# Patient Record
Sex: Female | Born: 1960 | Race: White | Hispanic: No | Marital: Married
Health system: Midwestern US, Community
[De-identification: ages and names within clinical notes are randomized; demographics above are authoritative.]

## PROBLEM LIST (undated history)

## (undated) DIAGNOSIS — Z1231 Encounter for screening mammogram for malignant neoplasm of breast: Secondary | ICD-10-CM

## (undated) DIAGNOSIS — N6019 Diffuse cystic mastopathy of unspecified breast: Secondary | ICD-10-CM

## (undated) DIAGNOSIS — R928 Other abnormal and inconclusive findings on diagnostic imaging of breast: Secondary | ICD-10-CM

## (undated) DIAGNOSIS — Z803 Family history of malignant neoplasm of breast: Secondary | ICD-10-CM

## (undated) DIAGNOSIS — Z1239 Encounter for other screening for malignant neoplasm of breast: Secondary | ICD-10-CM

---

## 2012-04-27 ENCOUNTER — Inpatient Hospital Stay
Admit: 2012-04-27 | Discharge: 2012-04-27 | Disposition: A | Discharge status: Home / Self Care | Attending: Emergency Medicine

## 2012-04-27 LAB — CBC WITH AUTO DIFFERENTIAL
Basophils %: 0.3 % (ref 0.0–2.0)
Basophils Absolute: 0 10*3 (ref 0.0–0.2)
Eosinophils %: 1 % (ref 0.0–5.0)
Eosinophils Absolute: 0.1 10*3 (ref 0.0–0.6)
Granulocyte Absolute Count: 5.2 10*3 (ref 1.7–7.7)
Hematocrit: 37.7 % (ref 36.0–48.0)
Hemoglobin: 12.7 gm/dl (ref 12.0–16.0)
Lymphocytes %: 19 % — ABNORMAL LOW (ref 25.0–40.0)
Lymphocytes Absolute: 1.4 10*3 (ref 1.0–5.1)
MCH: 31.9 pg (ref 26–34)
MCHC: 33.6 gm/dl (ref 31–36)
MCV: 94.8 fl (ref 80–100)
MPV: 8.3 fl (ref 5.0–10.5)
Monocytes %: 8.1 % (ref 0.0–12.0)
Monocytes Absolute: 0.6 10*3 (ref 0.0–1.3)
Platelets: 226 10*3 (ref 135–450)
RBC: 3.98 10*6 — ABNORMAL LOW (ref 4.0–5.2)
RDW: 12.9 % (ref 11.5–14.5)
Segs Relative: 71.6 % — ABNORMAL HIGH (ref 42.0–63.0)
WBC: 7.2 10*3 (ref 4.0–11.0)

## 2012-04-27 LAB — PROTIME-INR
INR: 1.08
Protime: 12.3 s (ref 10.0–12.8)

## 2012-04-27 LAB — APTT: aPTT: 27.3 s

## 2012-04-27 MED ORDER — ENOXAPARIN SODIUM 80 MG/0.8ML SC SOLN
80 MG/0.8ML | Freq: Once | SUBCUTANEOUS | Status: AC
Start: 2012-04-27 — End: 2012-04-27
  Administered 2012-04-27: 16:00:00 via SUBCUTANEOUS

## 2012-04-27 MED ORDER — WARFARIN SODIUM 5 MG PO TABS
5 MG | Freq: Once | ORAL | Status: AC
Start: 2012-04-27 — End: 2012-04-27
  Administered 2012-04-27: 17:00:00 via ORAL

## 2012-04-27 MED FILL — COUMADIN 5 MG PO TABS: 5 MG | ORAL | Qty: 2

## 2012-04-27 MED FILL — LOVENOX 80 MG/0.8ML SC SOLN: 80 MG/0.8ML | SUBCUTANEOUS | Qty: 0.8

## 2012-04-27 NOTE — ED Provider Notes (Signed)
I have fully participated in the care of this patient and I have personally preformed a face to face evaluation.  I have reviewed and agree with all pertinent clinical information including history, physical exam and medical decision making/plan. I have also reviewed and agree with the medications, allergies and past medical, family and social history section for this patient, unless otherwise noted.     HPI: Frances [name] presented with   Chief Complaint   Patient presents with   . Leg Pain     pt sent from vascular lab with + test for DVT        expectation: find out how to treat it     The patient is a 51 year old female who had recent right knee surgery and was sent by her orthopedic surgeon for a vascular study to rule out DVT today because of swelling and pain in her right leg and calf extending up into the knee.   Has no prior history of DVT.  No chest pain or shortness of breath.  Today, the Doppler study was positive for DVT.    Vital Signs:  BP 116/79  Pulse 86  Temp(Src) 98.6 F (37 C) (Oral)  Resp 16  Ht 5' 4.5" (1.638 m)  Wt 178 lb (80.74 kg)  BMI 30.09 kg/m2  SpO2 98%  LMP 04/27/2012  Alert 51 y.o. female who does not appear toxic or acutely ill  there is soft tissue swelling and tenderness in the right calf with no palpable cord.  Neurovascular status distally is intact with good movement of the foot and ankle, good perfusion.      Medical Decision Making and Plan:  Pertinent Labs & Imaging studies reviewed. (See chart for details)  I agree with PA assessment and plan.   venous Doppler report per the limit or a vascular port shows acutely partially occluding DVT involving the right distal femoral vein, popliteal vein, posterior tibial and peroneal veins to zone 8 and gastrocnemius vein in zone 5.  There is an acute totally occluded superficial vein thrombosis involving the right lesser saphenous vein as well.  No evidence of DVT or superficial vein thrombosis in the right lower extremity  and left common femoral vein.  Reflux is noted in the right saphenous femoral junction.      Case was discussed with the patient's PCP, Dr.Drohan.  He will refer the patient to the Coumadin clinic and requests that we give her an initial dose of Lovenox and start her on Coumadin.  He is agreeable with outpatient treatment.         ED course: The patient is stable.  Subcutaneous Lovenox 1 mg per kilogram given.  Coumadin orally and treated as well.    Disposition: Home.  Outpatient followup with Coumadin clinic.  She will be self injecting with Lovenox until she is adequately anticoagulated with Coumadin.  Instructions for DVT.    Blaine Hamper, MD  04/27/12 1225

## 2012-04-27 NOTE — Discharge Instructions (Signed)
Call the Coumadin clinic today for the next time you are supposed to have your blood drawn.    ELEVATION  ELEVATE LOWER LEG BY PUTTING DRESSER DRAWER BETWEEN MATTRESS AND BOX SPRINGAT THE FOOT OF THE BED.  SLEEP THAT WAY FOR 2-3 WEEKS.  IT MAY TAKE A FEW DAYS TO GET USED TO SLEEPING THAT WAY.      Return here to the emergency department if worsening pain.        Deep Vein Thrombosis  A deep vein thrombosis (DVT) is a blood clot (thrombus) that develops in a deep vein. A DVT is a clot in the deep, larger veins of the leg, arm, or pelvis. These are more dangerous than clots that might form in veins on the surface of the body. Deep vein thrombosis can lead to complications if the clot breaks off and travels in the bloodstream to the lungs.  CAUSES  Blood clots form in a vein for different reasons. Usually several things cause blood clots. They include:   The flow of blood slows down.   The inside of the vein is damaged in some way.   The person has a condition that makes blood clot more easily. These conditions may include:   Older age (especially over 44 years old).   Having a history of blood clots.   Having major or lengthy surgery. Hip surgery is particularly high-risk.   Breaking a hip or leg.   Sitting or lying still for a long time.   Cancer or cancer treatment.   Having a long, thin tube (catheter) placed inside a vein during a medical procedure.   Being overweight (obese).   Pregnancy and childbirth.   Medicines with estrogen.   Smoking.   Other circulation or heart problems.  SYMPTOMS  When a clot forms, it can either partially or totally block the blood flow in that vein. Symptoms of a DVT can include:   Swelling of the leg or arm, especially if one side is much worse.   Warmth and redness of the leg or arm, especially if one side is much worse.   Pain in an arm or leg. If the clot is in the leg, symptoms may be more noticeable or worse when standing or walking.  If the blood clot travels  to the lung, it may cause:   Shortness of breath.   Chest pain. The pain may be worsened by deep breaths.   Coughing up thick mucus (phlegm), possibly flecked with blood.  Anyone with these symptoms should get emergency medical treatment right away. Call your local emergency services (911 in [place] if you have these symptoms.  DIAGNOSIS  If a DVT is suspected, your caregiver will take a full medical history. He or she will also perform a physical exam. Tests that also may be required include:   Studies of the clotting properties of the blood.   An ultrasound scan.   X-rays to show the flow of blood when special dye is injected into the veins (venography).   Studies of your lungs if you have any chest symptoms.  PREVENTION   Exercise the legs regularly. Take a brisk 30 minute walk every day.   Maintain a weight that is appropriate for your height.   Avoid sitting or lying in bed for long periods of time without moving your legs.   Women, particularly those over the age of 51, should consider the risks and benefits of taking estrogen medicines, including birth control pills.  Do not smoke, especially if you take estrogen medicines.   Long-distance travel can increase your risk. You should exercise your legs by walking or pumping the muscles every hour.   In hospital prevention:   Prevention may include medical and nonmedical measures.  TREATMENT   The most common treatment for DVT is blood thinning (anticoagulant) medicine, which reduces the blood's tendency to clot. Anticoagulants can stop new blood clots from forming and old ones from growing. They cannot dissolve existing clots. Your body does this by itself over time. Anticoagulants can be given by mouth, by intravenous (IV) access, or by injection. Your caregiver will determine the best program for you.   Less commonly, clot-dissolving drugs (thrombolytics) are used to dissolve a DVT. They carry a high risk of bleeding, so they are used mainly in  severe cases.   Very rarely, a blood clot in the leg needs to be removed surgically.   If you are unable to take anticoagulants, your caregiver may arrange for you to have a filter placed in a main vein in your belly (abdomen). This filter prevents clots from traveling to your lungs.  HOME CARE INSTRUCTIONS   Take all medicines prescribed by your caregiver. Follow the directions carefully.   You will most likely continue taking anticoagulants after you leave the hospital. Your caregiver will advise you on the length of treatment (usually 3 to 6 months, sometimes for life).   Taking too much or too little of an anticoagulant is dangerous. While taking this type of medicine, you will need to have regular blood tests to be sure the dose is correct. The dose can change for many reasons. It is critically important that you take this medicine exactly as prescribed, and that you have blood tests exactly as directed.   Many foods can interfere with anticoagulants. These include foods high in vitamin K, such as spinach, kale, broccoli, cabbage, collard and turnip greens, Brussels sprouts, peas, cauliflower, seaweed, parsley, beef and pork liver, green tea, and soybean oil. Your caregiver should discuss limits on these foods with you or you should arrange a visit with a dietician to answer your questions.   Many medicines can interfere with anticoagulants. You must tell your caregiver about any and all medicines you take. This includes all vitamins and supplements. Be especially cautious with aspirin and anti-inflammatory medicines. Ask your caregiver before taking these.   Anticoagulants can have side effects, mostly excessive bruising or bleeding. You will need to hold pressure over cuts for longer than usual. Avoid alcoholic drinks or consume only very small amounts while taking this medicine.   If you are taking an anticoagulant:   Wear a medical alert bracelet.   Notify your dentist or other caregivers before  procedures.   Avoid contact sports.   Ask your caregiver how soon you can go back to normal activities. Not being active can lead to new clots. Ask for a list of what you should and should not do.   Exercise your lower leg muscles. This is important while traveling.   You may need to wear compression stockings. These are tight elastic stockings that apply pressure to the lower legs. This can help keep the blood in the legs from clotting.   If you are a smoker, you should quit.   Learn as much as you can about DVT.  SEEK MEDICAL CARE IF:   You have unusual bruising or any bleeding problems.   The swelling or pain in your affected arm  or leg is not gradually improving.   You anticipate surgery or long-distance travel. You should get specific advice on DVT prevention.   You discover other family members with blood clots. This may require further testing for inherited diseases or conditions.  SEEK IMMEDIATE MEDICAL CARE IF:   You develop chest pain.   You develop severe shortness of breath.   You begin to cough up bloody mucus or phlegm (sputum).   You feel dizzy or faint.   You develop swelling or pain in the leg.   You have breathing problems after traveling.  MAKE SURE YOU:   Understand these instructions.   Will watch your condition.   Will get help right away if you are not doing well or get worse.  Document Released: 12/13/2005 Document Revised: 12/02/2011 Document Reviewed: 02/04/2011  [facility] Patient Information 2012 [place].

## 2012-04-27 NOTE — ED Provider Notes (Signed)
HPI  Chief Complaint:    Chief Complaint   Patient presents with   ??? Leg Pain     pt sent from vascular lab with + test for DVT        expectation: find out how to treat it       Nursing Notes, Past Medical Hx, Past Surgical Hx, Social Hx, Allergies, and Family Hx were all reviewed and agreed with or any disagreements were addressed in the HPI.    HPI:  (Location, Duration, Timing, Severity, Quality, Assoc Sx, Context, Modifying factors)  This is [name]  51 y.o. female who presents to the emergency department stating that she had surgery on April 18, 2012.  She states that she went for her one-week postop appointment yesterday, was seen and told him he was having pain in her right leg.  She was also having swelling to the area.  Denies any fevers or chills, no nausea vomiting.  No chest pain or shortness of breath.  The orthopedic doctor, Dr. Clelia Croft, referred her to get an ultrasound.  That comes back today and is positive for DVT.  She presents here from the vascular lab.    Past Medical/Surgical History:  History reviewed. No pertinent past medical history.      Procedure Laterality Date   ??? Knee arthroscopy     ??? Breast biopsy         Medications:  No current facility-administered medications on file prior to encounter.     Current Outpatient Prescriptions on File Prior to Encounter   Medication Sig Dispense Refill   ??? warfarin (COUMADIN) 5 MG tablet Take 1 tablet by mouth every evening for 7 doses.  7 tablet  0       Review of Systems   Constitutional: Negative for fever and chills.   HENT: Negative for neck pain.    Respiratory: Negative for cough and shortness of breath.    Cardiovascular: Positive for leg swelling. Negative for chest pain and palpitations.   Gastrointestinal: Negative for nausea, vomiting and abdominal pain.   Genitourinary: Negative for dysuria.   All other systems reviewed and are negative.        Physical Exam   Nursing note and vitals reviewed.  Constitutional: She is oriented to person,  place, and time. She appears well-developed and well-nourished.   HENT:   Head: Normocephalic and atraumatic.   Right Ear: External ear normal.   Left Ear: External ear normal.   Nose: Nose normal.   Eyes: Right eye exhibits no discharge. Left eye exhibits no discharge.   Neck: Normal range of motion. Neck supple.   Cardiovascular: Normal rate, regular rhythm, normal heart sounds and intact distal pulses.  Exam reveals no gallop and no friction rub.    No murmur heard.  Pulmonary/Chest: Effort normal and breath sounds normal. No stridor. No respiratory distress. She has no wheezes. She has no rales. She exhibits no tenderness.   Genitourinary:   Stool Hemoccult controls are done on each sample card.  If stool hemoccult was done today, then controls were done as well.   Musculoskeletal: Normal range of motion. She exhibits edema and tenderness.        Right lower leg: She exhibits tenderness, swelling and edema.        Legs:  Neurological: She is alert and oriented to person, place, and time.   Skin: Skin is warm and dry. She is not diaphoretic. No pallor.   Psychiatric: She has [name]  mood and affect. Her behavior is normal.       Procedures    MDM    MEDICAL DECISION MAKING    Vitals:    Filed Vitals:    04/27/12 1136   BP: 116/79   Pulse: 86   Temp: 98.6 ??F (37 ??C)   TempSrc: Oral   Resp: 16   Height: 5' 4.5" (1.638 m)   Weight: 178 lb (80.74 kg)   SpO2: 98%       LABS:   Labs Reviewed   CBC WITH AUTO DIFFERENTIAL - Abnormal; Notable for the following:     RBC 3.98 (*)     Segs Relative 71.6 (*)     Lymphocytes Relative 19.0 (*)     All other components within normal limits   APTT   PROTIME-INR   THROMBOTIC VENOUS PANEL        Remainder of labs reviewed and were negative at this time or not returned at the time of this note.    Orders:  Orders Placed This Encounter   Procedures   ??? CBC auto differential   ??? APTT   ??? Protime-INR   ??? Thrombotic venous panel   ??? Inpatient consult to Internal Medicine   ??? Inpatient  consult to Orthopedic Surgery       EKG:     X-RAYS: The attending, Blaine Hamper, MD, and I have viewed the radiologic plain film image(s).  The plain films will be read or overread by the radiologist.  ALL OTHER NON-PLAIN FILM IMAGES SUCH AS CT, ULTRASOUND AND MRI HAVE BEEN READ BY THE RADIOLOGIST.        Vl Extremity Venous Right    04/27/2012  Vascular Lower Extremities DVT Study Procedure  -- PRELIMINARY SONOGRAPHER REPORT --    Demographics    Patient Name      Frances [name]    Date of Study     04/27/2012         Gender             Female    Patient Number    [id number]         Date of Birth      03-06-1961    Visit Number      [id number]        Age                62 year(s)    Accession Number  [id number]           Room Number    Corporate ID      [id number]           Sonographer        Christella Noa RVT,                                                          RDCS, RT    Ordering          Tammi Sou MD Interpreting       Elwin Mocha,  Physician                            Physician          Holenarsipur, MD   Procedure  Type of  Study:    Veins:Lower Extremities DVT Study, VL EXTREMITY VENOUS DUPLEX RIGHT.   Tech Comments Right Acute partially occluding deep vein thrombosis involving the right distal femoral vein,popliteal vein, posterior tibial veins, peroneal veins to zone 8 and gastrocnemius vein zone 5. Acute totally occluded superficial venous thrombosis involving the right lesser saphenous vein zone 5. No other evidence of deep vein or superficial vein thrombosis involving the right lower extremity and the left proximal common femoral vein Reflux noted in the right saphenous femoral junction..  Patient was transferred to the Emergency room per Dr. Clelia Croft.        PROCEDURES: [name]    CRITICAL CARE:  [name]    CONSULTATIONS: Consultation with Dr. Clelia Croft and her family Dr. Dr. Cathi Roan.    MEDICAL DECISION MAKING / ED COURSE:    Patient was given the following medications:  Medications   enoxaparin (LOVENOX)  injection 80 mg (80 mg Subcutaneous Given 04/27/12 1217)   warfarin (COUMADIN) tablet 10 mg (10 mg Oral Given 04/27/12 1256)       This patient has partially occluded DVT not completely occluding DVT.  We will give her Lovenox here and start her on Coumadin and then discharge her home with Lovenox and Coumadin.  Nothing to suggest pulmonary embolus at this time, she has normal oxygen saturation rate, she is not tachypneic or tachycardic.    The patient tolerated their visit well.  They were seen and evaluated by the attending physician who agreed with the assessment and plan.  The patient and / or the family were informed of the results of any tests, [name] time was given to answer questions, [name] plan was proposed and they agreed with plan.      CLINICAL IMPRESSION:    1. DVT (deep venous thrombosis)        DISPOSITION Decision to Discharge    PATIENT REFERRED TO:  coumadin clinic  [phone]  Today  for [name] recheck next week      DISCHARGE MEDICATIONS:  Discharge Medication List as of 04/27/2012 12:57 PM      START taking these medications    Details   warfarin (COUMADIN) 5 MG tablet Take 1 tablet by mouth every evening for 7 doses., Disp-7 tablet, R-0      enoxaparin (LOVENOX) 80 MG/0.8ML injection Inject 0.8 mLs into the skin 2 times daily for 7 days. May sub 14 vials, Disp-11.2 mL, R-0             (Please note the MDM and HPI sections of this note were completed with [name] voice recognition program.  Efforts were made to edit the dictations but occasionally words are mis-transcribed.)        [place]lin [place], [place]  04/27/12 1613

## 2012-05-03 LAB — THROMBOTIC VENOUS PANEL
Activated Protein C Resistance: 3.5 ratio (ref >=2.00)
Anti-Throm Enz: 88 % (ref 76–128)
Anticardiolipin IgA: 3 APL Units (ref 0–11)
Anticardiolipin IgG: 2 GPL Units (ref 0–14)
Cardiolipin Ab IgM: 0 MPL Units (ref 0–12)
Dilute Viper Venom Time: 37 s (ref 33–44)
Factor V Leiden: NEGATIVE
Homocystine,Blood: 7 umol/L (ref <=10)
Protein C-Functional: 89 % (ref 77–173)
Protein S Ag, Free: 86 % (ref 55–123)
Prothrombin Mutation: NEGATIVE
Protime: 13.5 s (ref 12.0–15.5)
aPTT: 41 s (ref 32–48)

## 2012-05-03 LAB — PROTIME-INR: INR: 2.8

## 2012-05-03 NOTE — Telephone Encounter (Signed)
Warfarin prescription phoned  into [facility] to [facility] under Dr. Marcelina Morel  Warfarin 5 mg tabs  Take 5mg  daily  90 days x no refills    Domenica Fail, Pharm D

## 2012-05-03 NOTE — Progress Notes (Signed)
Mrs. Frances [name] is a 51 y.o. y/o female with history of DVT who presents today for anticoagulation monitoring and adjustment.  Patient verifies current dosing regimen as listed above.  Patient denies s/s bleeding/bruising/swelling/SOB.  No blood in urine or stool.  No dietary changes.  No changes in medication/OTC agents/Herbals.  No Procedures scheduled in the future at this time.  Educated patient of signs and symptoms of bleeding and clotting, when to seek emergent care, the need to maintain a consistent diet and notification of clinic for any new medications including over the counter medications or abnormal bleeding.    INR 2.8 today  Pt stated she was given 10 mg of warfarin upon discharge (5-2), then continued with 5mg  daily. Has had 35 mg of warfarin in last 6 days. Takes her warfarin at dinner.  Has also been using the Lovenox injections.  Will have pt continue with 5mg  daily (35mg /week) and RTC in one week    Referring physician: Dr Trenton Founds  INR (no units)   Date Value   05/03/2012 2.8    04/27/2012 1.08

## 2012-05-10 LAB — PROTIME-INR: INR: 4.9

## 2012-05-10 NOTE — Progress Notes (Signed)
Mrs. XIARA KNISLEY is a 51 y.o. y/o female with history of DVT who presents today for anticoagulation monitoring and adjustment.  Patient verifies current dosing regimen as listed above.  Patient denies s/s bleeding/bruising/swelling/SOB.  No blood in urine or stool.  No dietary changes.  No changes in medication/OTC agents/Herbals.  No Procedures scheduled in the future at this time.  Educated patient of signs and symptoms of bleeding and clotting, when to seek emergent care, the need to maintain a consistent diet and notification of clinic for any new medications including over the counter medications or abnormal bleeding.    INR 4.9 today  Pt has swollen right calf today - same side as surgery. She returned to work this week and hasn't been able to elevate her leg as much as before.  Pt also reports that she stopped eating vegetables completely this week; explains high INR.  Hold today's dose. Will reduce Friday's dose to 2.5mg  with 5mg  all other days.    Referring physician: Dr Trenton Founds  INR (no units)   Date Value   05/10/2012 4.9    05/03/2012 2.8    04/27/2012 1.08

## 2012-05-17 LAB — PROTIME-INR: INR: 3

## 2012-05-17 NOTE — Progress Notes (Signed)
Mrs. Frances [name] is a 51 y.o. y/o female with history of DVT who presents today for anticoagulation monitoring and adjustment.  Patient verifies current dosing regimen as listed above.  Patient denies s/s bleeding/bruising/swelling/SOB.  No blood in urine or stool.  No dietary changes.  No changes in medication/OTC agents/Herbals.  No Procedures scheduled in the future at this time.    INR 3 today  Took warfarin as directed last visit; added in some salads/vegetables since last visit  Continues to have some swelling in R foot but relieved with elevation  Will be leaving for [facility] on Saturday; will return 5-31  Since pt is at higher end of range with the change in diet, will decrease dose slightly by another 2.5mg /week  RTC 05-29-12      Referring physician: Dr Trenton Founds  INR (no units)   Date Value   05/17/2012 3    05/10/2012 4.9    05/03/2012 2.8    04/27/2012 1.08

## 2012-05-17 NOTE — Telephone Encounter (Signed)
Called & spoke to [facility] @ Dr. Bascom Levels office.  She states they did not receive the fax.  Confirmed fax#.  She asked me to re-fax to her attention.  Re-faxed & called the office.  Spoke to [place].  She confirmed that [facility] received our fax.  This was the latest AVS which included all of the INR results for May.

## 2012-05-29 LAB — PROTIME-INR: INR: 2.4

## 2012-05-29 NOTE — Progress Notes (Signed)
Mrs. Frances [name] is a 51 y.o. y/o female with history of DVT who presents today for anticoagulation monitoring and adjustment.  Patient verifies current dosing regimen as listed above.  Patient denies s/s bleeding/bruising/swelling/SOB.  No blood in urine or stool.  No dietary changes.  No changes in medication/OTC agents/Herbals.  No Procedures scheduled in the future at this time.    INR 2.4 today  Continues to have some swelling in R foot but relieved with elevation. Is much better since last visit.  Start Physical therapy today    Missed dose Monday 5/27. Will continue same dose and consider reduction of elevated on next visit    RTC 2 weeks    Referring physician: Dr Trenton Founds  INR (no units)   Date Value   05/17/2012 3    05/10/2012 4.9    05/03/2012 2.8    04/27/2012 1.08

## 2012-06-12 LAB — PROTIME-INR: INR: 2.6

## 2012-06-12 NOTE — Progress Notes (Signed)
Mrs. Frances [name] is a 51 y.o. y/o female with history of DVT who presents today for anticoagulation monitoring and adjustment.  Patient verifies current dosing regimen as listed above.  Patient denies s/s bleeding/bruising/swelling/SOB.  No blood in urine or stool.  No dietary changes.  No changes in medication/OTC agents/Herbals.  No Procedures scheduled in the future at this time.    INR 2.6 today  Continues to have some swelling in R foot but relieved with elevation. Is much better since last visit.  In Physical therapy   RTC 4 weeks    Referring physician: Dr Trenton Founds  INR (no units)   Date Value   06/12/2012 2.6    05/29/2012 2.4    05/17/2012 3    05/10/2012 4.9

## 2012-06-13 ENCOUNTER — Encounter

## 2012-06-14 NOTE — Progress Notes (Signed)
[facility]  Progress Note  Frances Bushman, MD  [phone]  [phone]       Hollice Gong   Date of Birth:  [birth year]    Date of Visit:  06/14/2012    History of Present Illness  Patient presents for annual breast exam.      Relevant Past Medical/Surgical History  Past Surgical History   Procedure Laterality Date   ??? Knee arthroscopy     ??? Breast biopsy     No past medical history on file.       Review of Systems    Breast Mass: No  Breast Pain: No  Nipple Discharge: No  Skin Lesions/Changes: No  Weight Changes: No  Other: No    Physical Exam   Constitutional: She is oriented to person, place, and time. She appears well-developed and well-nourished. No distress.   Neck: Normal range of motion. Neck supple. No thyromegaly present.   Pulmonary/Chest:       Musculoskeletal: Normal range of motion.   Lymphadenopathy:     She has no cervical adenopathy.   Neurological: She is alert and oriented to person, place, and time.   Skin: Skin is warm and dry. No rash noted. No erythema.   Psychiatric: She has a normal mood and affect. Her behavior is normal. Thought content normal.       Assessment  Patient presents with benign breast examination and stable bilateral screening mammogram.    Plan  Patient is to return in one year for a repeat breast examination and bilateral screening mammogram.

## 2012-07-10 LAB — PROTIME-INR
INR: 1.5
Protime: 18.2 seconds

## 2012-07-10 NOTE — Telephone Encounter (Signed)
Warfarin prescription phoned  into [facility] for [facility] [id number] Dr. Trenton Founds  Warfarin 5 mg tabs  Take 2.5mg  on Mon and 5mg  all other days  90 days x 1 refill

## 2012-07-10 NOTE — Progress Notes (Signed)
Mrs. BRAYLEN DENUNZIO is a 51 y.o. 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  Patient verifies current dosing regimen as listed above.  Patient denies s/s bleeding/bruising/swelling/SOB.  No blood in urine or stool.  Note dietary changes. Pt has increased her salads and vegetable diet and plans to continue with current level  No changes in medication/OTC agents/Herbals.  No Procedures scheduled in the future at this time.  No missed doses  INR 1.5 today  Continues to have some swelling in R foot but relieved with elevation. Is much better since last visit.  In Physical therapy   RTC 10 days    Referring physician: Dr Trenton Founds  INR (no units)   Date Value   07/10/2012 1.5    06/12/2012 2.6    05/29/2012 2.4    05/17/2012 3

## 2012-07-19 LAB — PROTIME-INR
INR: 1.8
Protime: 22 seconds

## 2012-07-19 NOTE — Progress Notes (Signed)
Mrs. Frances [name] is a 51 y.o. 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  Patient verifies current dosing regimen as listed above.  Patient denies s/s bleeding/bruising/swelling/SOB.  No blood in urine or stool.  Note dietary changes. Pt has cut back a little on her salads and vegetable diet   No changes in medication/OTC agents/Herbals.  No Procedures scheduled in the future at this time.  No missed doses    INR 1.8 today  Swelling is greatly reduced  Take 7.5mg  today only then increase weekly dose to 5mg  daily  RTC 2 weeks    Referring physician: Dr Trenton Founds  INR (no units)   Date Value   07/19/2012 1.8    07/10/2012 1.5    06/12/2012 2.6    05/29/2012 2.4

## 2012-08-02 LAB — PROTIME-INR: INR: 2.6

## 2012-08-02 NOTE — Progress Notes (Signed)
Mrs. Frances [name] is a 51 y.o. 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  Patient verifies current dosing regimen as listed above.  Patient denies s/s bleeding/bruising/swelling/SOB.  No blood in urine or stool.  No dietary changes.   No changes in medication/OTC agents/Herbals.  No Procedures scheduled in the future at this time.  No missed doses    INR 2.6 today  Slightly more swelling in leg today; increased warfarin as instructed last visit  Will contact doctor re: f/u ultrasound to see status of clots/if she needs to continue warfarin  Continue same dose; RTC 2 weeks  If pt remains therapeutic consider 4 week appts     Referring physician: Dr Trenton Founds  Phone: [phone]  Fax: [phone]  INR (no units)   Date Value   08/02/2012 2.6    07/19/2012 1.8    07/10/2012 1.5    06/12/2012 2.6

## 2012-08-16 LAB — PROTIME-INR
INR: 2.2
Protime: 26.3 seconds

## 2012-08-16 NOTE — Progress Notes (Signed)
Mrs. Frances [name] is a 51 y.o. 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  Patient verifies current dosing regimen as listed above.  Patient denies s/s bleeding/bruising/swelling/SOB.  No blood in urine or stool.  No dietary changes.   No changes in medication/OTC agents/Herbals.  No Procedures scheduled in the future at this time.  No missed doses  Some swelling continues in leg;   Discussed length of therapy. Informed Pt that typically it would be for 6 months since this is her first DVT.  She must be re evaluated by her referring physician at that time.    INR 2.2 today  Continue same dose;   RTC 4 weeks    Referring physician: Dr Trenton Founds send by fax (Epic has not been saving the number)  Phone: [phone]  Fax: [phone]  INR (no units)   Date Value   08/16/2012 2.2    08/02/2012 2.6    07/19/2012 1.8    07/10/2012 1.5

## 2012-09-13 LAB — PROTIME-INR
INR: 2.2
Protime: 26.1 seconds

## 2012-09-13 NOTE — Progress Notes (Signed)
Mrs. Frances [name] is a 51 y.o. 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  Patient verifies current dosing regimen as listed above.  Patient denies s/s bleeding/bruising/swelling/SOB.  No blood in urine or stool.  No dietary changes.   No changes in medication/OTC agents/Herbals.  No Procedures scheduled in the future at this time.  No missed doses  Some swelling continues in leg;   Discussed length of therapy. Informed Pt that typically it would be for 6 months since this is her first DVT.  She must be re evaluated by her referring physician at that time.    INR 2.2 today  Continue same dose;   RTC 4 weeks    Referring physician: Dr Trenton Founds send by fax (Epic has not been saving the number)  Phone: [phone]  Fax: [phone]  INR (no units)   Date Value   09/13/2012 2.2    08/16/2012 2.2    08/02/2012 2.6    07/19/2012 1.8

## 2012-10-11 LAB — PROTIME-INR: INR: 1.6

## 2012-10-11 NOTE — Progress Notes (Signed)
Mrs. Frances [name] is a 51 y.o. 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  Patient verifies current dosing regimen as listed above.  Patient denies s/s bleeding/bruising/swelling/SOB.  No blood in urine or stool.  No dietary changes.   No changes in medication/OTC agents/Herbals.  No Procedures scheduled in the future at this time    INR 1.6 today  Forgot to take her warfarin on Friday. No other changes  Will have pt take 7.5mg  tonight then continue taking 5mg  daily  RTC 4 weeks    Referring physician: Dr Trenton Founds send by fax (Epic has not been saving the number)  Phone: [phone]  Fax: [phone]  INR (no units)   Date Value   10/11/2012 1.6    09/13/2012 2.2    08/16/2012 2.2    08/02/2012 2.6

## 2012-10-19 NOTE — Telephone Encounter (Signed)
Message copied by Elisha Headland on Thu Oct 19, 2012  2:23 PM  ------       Message from: Clance Boll A       Created: Thu Oct 19, 2012  1:44 PM       Contact: Self         Frances [name] called the clinic because she is getting a cold and wants to know if she can take Emergen-C and if she needs to take a cold medicine which ones are safe with Warfarin. Please call [phone]  ------

## 2012-10-19 NOTE — Telephone Encounter (Signed)
Spoke with [name] who stated she felt like she was getting a cold; has a runny nose and slight cough. Normally she takes Emergen-C and it makes her feel better. Told her that would be fine to take with the warfarin. She asked what else she could take if that did not help. Explained she could take plain Robitussin or Mucinex for the cough or Sudafed if needed. Told her she should stay away from the products with DM.    Domenica Fail, PharmD

## 2012-10-26 NOTE — Telephone Encounter (Signed)
Message copied by Marlinda Mike on Thu Oct 26, 2012 10:31 AM  ------       Message from: Clance Boll A       Created: Thu Oct 26, 2012  9:50 AM       Contact: Self         Frances [name] called asking what she can safely take for a cold with Warfarin. She spoke with [name] last week but left the list at work. Please call [phone] and remind her what she can take,   ------

## 2012-10-26 NOTE — Telephone Encounter (Signed)
Pt asked if she can take sudafed.  Yes no problem with the INR or warfarin

## 2012-11-08 LAB — PROTIME-INR
INR: 2.4
Protime: 29.3 seconds

## 2012-11-08 NOTE — Progress Notes (Signed)
Mrs. Frances [name] is a 51 y.o. 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  Patient verifies current dosing regimen as listed above.  Patient denies s/s bleeding/bruising/swelling/SOB.  No blood in urine or stool.  No dietary changes.   No changes in medication/OTC agents/Herbals.  No Procedures scheduled in the future at this time  Discussed with Pt the planned length of warfarin therapy. She will check with her PCP and notify us.    INR 2.4 today  Will continue taking 5mg  daily  RTC 4 weeks    Referring physician: Dr Trenton Founds send by fax (Epic has not been saving the number)  Phone: [phone]  Fax: [phone]  INR (no units)   Date Value   11/08/2012 2.4    10/11/2012 1.6    09/13/2012 2.2    08/16/2012 2.2

## 2013-05-23 ENCOUNTER — Encounter

## 2013-06-18 NOTE — Progress Notes (Addendum)
[facility]  Progress Note  Ian Bushman, MD  [phone]  [phone]       Hollice Gong   Date of Birth:  November 29, 1961    Date of Visit:  06/18/2013    History of Present Illness  Patient presents for annual breast exam.      Relevant Past Medical/Surgical History  Past Surgical History   Procedure Laterality Date   ??? Knee arthroscopy     ??? Breast biopsy     No past medical history on file.       Review of Systems    Breast Mass: No  Breast Pain: No  Nipple Discharge: No  Skin Lesions/Changes: No  Weight Changes: No  Other: No    Physical Exam   Constitutional: She is oriented to person, place, and time. She appears well-developed and well-nourished. No distress.   Neck: Normal range of motion. Neck supple. No thyromegaly present.   Pulmonary/Chest:       Musculoskeletal: Normal range of motion.   Lymphadenopathy:     She has no cervical adenopathy.   Neurological: She is alert and oriented to person, place, and time.   Skin: Skin is warm and dry. No rash noted. No erythema.   Psychiatric: She has a normal mood and affect. Her behavior is normal. Thought content normal.       Assessment  Patient presents with benign breast examination and stable bilateral screening mammogram.    Plan  Patient is to return in one year for a repeat breast examination and bilateral screening mammogram.   Self breast examination techniques were reviewed with the patient, particularly in regards to finding inverted nipples and breast lumps.

## 2013-06-19 ENCOUNTER — Encounter

## 2014-07-03 ENCOUNTER — Encounter

## 2014-07-03 NOTE — Progress Notes (Signed)
Patient Name: Frances [name]  Date of Birth: 1961/11/29  Primary Care Physician: [facility], MD    Subjective: Patient presents for follow up and clinical exam.  She has no new breast complaints      Objective:  Breast:  Bilateral breasts are symmetrical. The bilateral nipples are everted without discharge. The skin bilaterally is without erythema/thickening (peau d'orange)/dimpling. There are no palpable masses/lesions bilaterally.  There is a well healed left breast surgical scar far lateral breast.   Axilla:  No evidence of bilateral palpable adenopathy    Imaging:  Bilateral mammogram   Preliminary Report  Dr. Larose Kells  Few benign appearing calcifications right upper outer quadrant  Overall, stable    Assessment:   1.  History of left breast surgical excisional biopsy 10/24/2009 with findings of focal flat atypia  2.  Fibrocystic breast disease  3.  Family history of breast cancer, non first degree relative (maternal great grandmother)      Plan:  1. Today's imaging reviewed with patient  2. Return to clinic 1 year, sooner if needed  3. Continue annual screening mammograms  4. Continue monthly self breast exams

## 2015-07-11 ENCOUNTER — Encounter

## 2015-07-11 ENCOUNTER — Ambulatory Visit: Admit: 2015-07-11

## 2015-07-11 ENCOUNTER — Inpatient Hospital Stay: Admit: 2015-07-11 | Attending: Surgery

## 2015-07-11 ENCOUNTER — Ambulatory Visit: Admit: 2015-07-11 | Discharge: 2015-07-11 | Payer: BLUE CROSS/BLUE SHIELD | Attending: Surgery

## 2015-07-11 DIAGNOSIS — Z1231 Encounter for screening mammogram for malignant neoplasm of breast: Secondary | ICD-10-CM

## 2015-07-11 DIAGNOSIS — N6012 Diffuse cystic mastopathy of left breast: Secondary | ICD-10-CM

## 2015-07-11 NOTE — Progress Notes (Signed)
Patient Name: Frances [name]  Date of Birth: 15-Mar-1961  Primary Care Physician: [facility], MD  ??  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  She reports no breast changes since her last visit.  The patient has no breast complaints.  She denies any bilateral palpable masses/lesions.  She denies any bilateral skin changes/erythema/thickening/dimpling.  She denies any nipple changes/retraction or discharge bilaterally.    Vitals:    07/11/15 1121   BP: 115/77   Site: Left Arm   Position: Sitting   Cuff Size: Large Adult   Pulse: 77   Weight: 193 lb (87.5 kg)   Height: 5' 4"  (1.626 m)           ROS  Constitutional: no weight loss, fever, night sweats   Skin: negative  Cardiovascular: no chest pain or palpitations   Pulmonary: No cough, sputum, or hemoptysis   GI:No abdominal pain  Breast: see above  All other systems were reviewed and are negative    ??  ??  Objective:  Breast:  Bilateral breasts are symmetrical. The bilateral nipples are everted without discharge. The skin bilaterally is without erythema/thickening (peau d'orange)/dimpling. There are no palpable masses/lesions bilaterally. There is a well healed left breast surgical scar far lateral breast.   Axilla:  No evidence of bilateral palpable adenopathy  General: AAO x 3, NAD  Heart: RRR, no murmurs/gallops/rubs  Lungs: CTA B/L, no wheezes, rhonchi, rales   Neck: Supple, No JVD/Thyromegaly       Imaging:  Bilateral mammogram (screening and diagnostic)  Preliminary Report  Dr. Emmaline Kluver  Screening: Architectural distortion retro-areolar right breast on CCV view; small nodule inferior left breast on MLO  Diagnostic: Neither finding confirmed  ACR-2    Gail Model Risk Assessment:  5 year risk:      This women: 1.6 %      Average women: 1.4 %  Lifetime risk to age 51:        This women: 11.8 %        Average women: 10.4 %    Tyrer- Cuzick Risk Assessment:       This  patient's lifetime risk: 26.2 %       Probability of a BRCA 1 gene mutation: 0.03 %       Probability of a BRCA 2 gene mutation: 0.09 %      ??  Assessment:   1. History of left breast surgical excisional biopsy 10/24/2009 with findings of focal flat atypia  2. Fibrocystic breast disease, stable and unchanged  3. Family history of breast cancer, non first degree relative (maternal great grandmother)  ??  ??  Plan:  1. Today's imaging reviewed with patient  2. Return to clinic six months, sooner if needed  3. High risk follow up and screening reviewed with patient secondary to her history of atypia.  All questions answered. I have discussed the recommendation for high risk patients of twice annual clinical exams, annual screening mammogram, annual screening MRI, and risk reduction medications. I have explained that in addition to her annual breast exam completed at the time of her gynecologic exam I recommended two clinical exams completed by a breast specialist (6 months apart). I have also discussed the use of annual screening imaging with both mammogram and MRI completed 6 months apart from each other so that she will be obtaining two imaging exams annually. Finally, I reviewed risk reduction medications  with the patient.  Patient wishes to begin high risk screening   4. Continue annual screening mammograms  5. Continue monthly self breast exams  6. MRI six months  7. The patient's risk assessment was reviewed with her and all questions were answered.        A total of 15 minutes was spent face to face/involved with this patient, Greater than 50% was spent counseling, discussing imaging findings/report, reviewing her chart and answering her questions.

## 2015-07-14 NOTE — Telephone Encounter (Signed)
Patient calling stating when she was here Friday you told her she had MLO.  Patient would like to know what that stands for.  Please advise.

## 2015-07-15 NOTE — Telephone Encounter (Signed)
LVM explaining to patient what view she has on her mammogram

## 2015-07-15 NOTE — Telephone Encounter (Signed)
MLO stands for Medial Lateral Oblique.  It's one of the views that they take when doing a mammogram.  I would have been talking about her mammogram.

## 2016-01-05 ENCOUNTER — Encounter

## 2016-01-06 ENCOUNTER — Inpatient Hospital Stay: Admit: 2016-01-06 | Attending: Surgery

## 2016-01-06 DIAGNOSIS — N6012 Diffuse cystic mastopathy of left breast: Secondary | ICD-10-CM

## 2016-01-06 MED ORDER — GADOPENTETATE DIMEGLUMINE 469.01 MG/ML IV SOLN
469.01 MG/ML | Freq: Once | INTRAVENOUS | Status: AC | PRN
Start: 2016-01-06 — End: 2016-01-06
  Administered 2016-01-06: 14:00:00 via INTRAVENOUS

## 2016-01-07 NOTE — Telephone Encounter (Signed)
-----   Message from [facility] Meeting[facility]ary M Shapiro-Wright, DO sent at 01/07/2016  9:14 AM EST -----  Please let patient know that breast MRI is within normal limits.

## 2016-01-07 NOTE — Telephone Encounter (Signed)
Left message for patient to call for results.

## 2016-01-08 NOTE — Telephone Encounter (Signed)
Patient informed of results.

## 2016-01-13 ENCOUNTER — Ambulatory Visit: Admit: 2016-01-13 | Discharge: 2016-01-13 | Payer: BLUE CROSS/BLUE SHIELD | Attending: Surgery

## 2016-01-13 DIAGNOSIS — Z9189 Other specified personal risk factors, not elsewhere classified: Secondary | ICD-10-CM

## 2016-01-13 NOTE — Patient Instructions (Addendum)
Breast Self-Exam: Care Instructions  Your Care Instructions  A breast self-exam is when you check your breasts for lumps or changes. This regular exam helps you learn how your breasts normally look and feel. Most breast problems or changes are not because of cancer.  Breast self-exam is not a substitute for a mammogram. Having regular breast exams by your doctor and regular mammograms improve your chances of finding any problems with your breasts.  Some women set a time each month to do a step-by-step breast self-exam. Other women like a less formal system. They might look at their breasts as they brush their teeth, or feel their breasts once in a while in the shower.  If you notice a change in your breast, tell your doctor.  Follow-up care is a key part of your treatment and safety. Be sure to make and go to all appointments, and call your doctor if you are having problems. It???s also a good idea to know your test results and keep a list of the medicines you take.  How do you do a breast self-exam?  ?? The best time to examine your breasts is usually one week after your menstrual period begins. Your breasts should not be tender then. If you do not have periods, you might do your exam on a day of the month that is easy to remember.  ?? To examine your breasts:  ?? Remove all your clothes above the waist and lie down. When you are lying down, your breast tissue spreads evenly over your chest wall, which makes it easier to feel all your breast tissue.  ?? Use the pads???not the fingertips???of the 3 middle fingers of your left hand to check your right breast. Move your fingers slowly in small coin-sized circles that overlap.  ?? Use three levels of pressure to feel of all your breast tissue. Use light pressure to feel the tissue close to the skin surface. Use medium pressure to feel a little deeper. Use firm pressure to feel your tissue close to your breastbone and ribs. Use each pressure level to feel your breast tissue  before moving on to the next spot.  ?? Check your entire breast, moving up and down as if following a strip from the collarbone to the bra line, and from the armpit to the ribs. Repeat until you have covered the entire breast.  ?? Repeat this procedure for your left breast, using the pads of the 3 middle fingers of your right hand.  ?? To examine your breasts while in the shower:  ?? Place one arm over your head and lightly soap your breast on that side.  ?? Using the pads of your fingers, gently move your hand over your breast (in the strip pattern described above), feeling carefully for any lumps or changes.  ?? Repeat for the other breast.  ?? Have your doctor inspect anything you notice to see if you need further testing.  Where can you learn more?  Go to [email or web address] and sign in to your MyChart account. Enter P148 in the Search Health Information box to learn more about ???Breast Self-Exam: Care Instructions.???    If you do not have an account, please click on the ???Sign Up Now??? link.  ?? 2006-2016 [employer]. Care instructions adapted under license by [facility]. This care instruction is for use with your licensed healthcare professional. If you have questions about a medical condition or this instruction, always ask your healthcare professional. [employer], Inc[employer] disclaims any   warranty or liability for your use of this information.  Content Version: 11.0.578772; Current as of: February 20, 2015

## 2016-01-13 NOTE — Progress Notes (Signed)
Patient Name: Frances [name]  Date of Birth: Jan 22, 1961  Primary Care Physician: [facility], MD  ????  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  She recently underwent breast MRI screening.  She reports no breast health changes since her last visit.  . The patient has no breast complaints. She denies any bilateral palpable masses/lesions. She denies any bilateral skin changes/erythema/thickening/dimpling. She denies any nipple changes/retraction or discharge bilaterally.  She reports no new family members have been diagnosed with breast cancer since her last visit.  She reports no new healthy concerns/changes since her last visit.        Vitals:    01/13/16 1515   BP: 113/68   Pulse: 98   Weight: 192 lb (87.1 kg)   Height: 5' 4"  (1.626 m)     ??  ??  ??  ??  ROS  Constitutional: no weight loss, fever, night sweats   Skin: negative  Cardiovascular: no chest pain or palpitations   Pulmonary: No cough, sputum, or hemoptysis   GI:No abdominal pain  Breast: see above  All other systems were reviewed and are negative  ??  ????  ????  Objective:  Breast:  Bilateral breasts are symmetrical. The bilateral nipples are everted without discharge. The skin bilaterally is without erythema/thickening (peau d'orange)/dimpling. There are no palpable masses/lesions bilaterally. There is a well healed left breast surgical scar far lateral breast.   Axilla:  No evidence of bilateral palpable adenopathy  General: AAO x 3, NAD  Heart: RRR, no murmurs/gallops/rubs  Lungs: CTA B/L, no wheezes, rhonchi, rales   Neck: Supple, No JVD/Thyromegaly   ??  ??  Imaging:  Breast MRI 1/1/0/17 reviewed  ??  Baker Janus Model Risk Assessment:  5 year risk:  This women: 1.6 %  Average women: 1.4 %  Lifetime risk to age 66:  This women: 11.8 %  Average women: 10.4 %  ??  TyrerLowella Petties Risk Assessment:  This patient's lifetime risk: 26.2 %  Probability of a  BRCA 1 gene mutation: 0.03 %  Probability of a BRCA 2 gene mutation: 0.09 %  ??  ??  ????  Assessment:   1. History of left breast surgical excisional biopsy 10/24/2009 with findings of focal flat atypia  2. Fibrocystic breast disease, stable and unchanged  3. Family history of breast cancer, non first degree relative (maternal great grandmother)  4. High risk for breast cancer secondary to history of FFA and Tyrer-Cuzick lifetime risk of 26.2%    Plan:  1.  Again reviewed high risk screening recommendations and answered all patient questions  2.  Return to clinic six months, sooner if needed  3.  Bilateral mammogram six months   4.  Continue annual screening mammogram, next due six months  5.  Continue high risk screening  6.  MRI one year  7.  Continue monthly self exams    A total of 10 minutes was spent face to face/involved with this patient, Greater than 50% was spent counseling, discussing imaging findings/report, reviewing her chart and answering her questions.      ????

## 2016-07-06 ENCOUNTER — Inpatient Hospital Stay: Admit: 2016-07-06 | Attending: Surgery

## 2016-07-06 ENCOUNTER — Ambulatory Visit: Admit: 2016-07-06 | Discharge: 2016-07-06 | Payer: BLUE CROSS/BLUE SHIELD | Attending: Surgery

## 2016-07-06 DIAGNOSIS — Z9189 Other specified personal risk factors, not elsewhere classified: Secondary | ICD-10-CM

## 2016-07-06 DIAGNOSIS — N6012 Diffuse cystic mastopathy of left breast: Secondary | ICD-10-CM

## 2016-07-06 NOTE — Progress Notes (Signed)
Patient Name: Frances [name]  Date of Birth: August 29, 1961  Primary Care Physician: [facility], MD  ????  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  She reports no changes in her breast health since her last visit.  She states she is doing well. The patient has no breast complaints.  She denies any bilateral palpable masses/lesions.  She denies any bilateral skin changes/erythema/thickening/dimpling.  She denies any nipple changes/retraction or discharge bilaterally.  She reports no new family members have been diagnosed with breast cancer since her last visit.  She reports no new healthy concerns/changes since her last visit.        Vitals:    07/06/16 1547   BP: 117/75   Pulse: 86   Weight: 199 lb (90.3 kg)   Height: 5' 4"  (1.626 m)     ??  ????  ????  ROS  Constitutional: no weight loss, fever, night sweats   Skin: negative  Cardiovascular: no chest pain or palpitations   Pulmonary: No cough, sputum, or hemoptysis   GI:No abdominal pain  Breast: see above  All other systems were reviewed and are negative  ????  ????  ????  Objective:  Breast:  Bilateral breasts are symmetrical. The bilateral nipples are everted without discharge. The skin bilaterally is without erythema/thickening (peau d'orange)/dimpling. There are no palpable masses/lesions bilaterally. There is a well healed left breast surgical scar far lateral breast.   Axilla:  No evidence of bilateral palpable adenopathy  General: AAO x 3, NAD  Heart: RRR, no murmurs/gallops/rubs  Lungs: CTA B/L, no wheezes, rhonchi, rales   Neck: Supple, No JVD/Thyromegaly   ????  ????  Imaging:  Bilateral mammogram  Preliminary Report  Dr. Merleen Nicely  BI-RADS 2, Benign  ????  Baker Janus Model Risk Assessment:  5 year risk:  This women: 1.6 %  Average women: 1.4 %  Lifetime risk to age 64:  This women: 11.8 %  Average women: 10.4 %  ????  TyrerLowella Petties Risk Assessment:  This patient's lifetime risk: 26.2  %  Probability of a BRCA 1 gene mutation: 0.03 %  Probability of a BRCA 2 gene mutation: 0.09 %  ????  ????  ????  Assessment:   1. History of left breast surgical excisional biopsy 10/24/2009 with findings of focal flat atypia  2. Fibrocystic breast disease, stable and unchanged  3. Family history of breast cancer, non first degree relative (maternal great grandmother)  4. High risk for breast cancer secondary to history of FFA and Tyrer-Cuzick lifetime risk of 26.2%  ??  Plan:  1.  Today's imaging results reviewed and discussed with the patient, all questions answered.    2.  Return to clinic six months, sooner if needed  3.  Bilateral mammogram one year  4.  Importance and technique of self breast exams reviewed with patient.  All questions answered.    5.  Continue high risk screening  6.  MRI six months  7.  Continue monthly self exams  ??  A total of 10 minutes was spent face to face/involved with this patient, Greater than 50% was spent counseling, discussing imaging findings/report, reviewing her chart and answering her questions.    ??

## 2016-09-13 NOTE — Telephone Encounter (Signed)
Patient needs an order for an MRI

## 2016-09-14 ENCOUNTER — Encounter

## 2016-09-14 NOTE — Telephone Encounter (Signed)
Order complete

## 2017-01-12 ENCOUNTER — Encounter

## 2017-01-18 NOTE — Telephone Encounter (Signed)
LMOVM for patient, informing her of results. Advised to call if any questions.

## 2017-01-18 NOTE — Telephone Encounter (Signed)
-----   Message from [facility] Shapiro-Wright, DO sent at 01/18/2017  8:36 AM EST -----  Per report, patient's MRI is within normal limits

## 2017-01-19 ENCOUNTER — Encounter: Attending: Surgery

## 2017-05-31 NOTE — Telephone Encounter (Signed)
Pt requesting medical record form be faxed to her employer at [phone]

## 2017-06-01 NOTE — Telephone Encounter (Signed)
Release form faxed to patient

## 2017-07-07 ENCOUNTER — Encounter

## 2017-07-07 ENCOUNTER — Inpatient Hospital Stay: Admit: 2017-07-07 | Attending: Surgery

## 2017-07-07 DIAGNOSIS — Z1231 Encounter for screening mammogram for malignant neoplasm of breast: Secondary | ICD-10-CM

## 2018-03-14 ENCOUNTER — Encounter

## 2018-03-14 ENCOUNTER — Inpatient Hospital Stay: Payer: PRIVATE HEALTH INSURANCE

## 2018-03-14 DIAGNOSIS — Z803 Family history of malignant neoplasm of breast: Secondary | ICD-10-CM

## 2018-07-17 ENCOUNTER — Encounter

## 2018-07-17 ENCOUNTER — Inpatient Hospital Stay: Admit: 2018-07-17 | Payer: PRIVATE HEALTH INSURANCE

## 2018-07-17 DIAGNOSIS — Z1231 Encounter for screening mammogram for malignant neoplasm of breast: Secondary | ICD-10-CM

## 2019-02-12 ENCOUNTER — Inpatient Hospital Stay: Admit: 2019-02-12 | Payer: PRIVATE HEALTH INSURANCE

## 2019-02-12 ENCOUNTER — Encounter

## 2019-02-12 DIAGNOSIS — N6082 Other benign mammary dysplasias of left breast: Secondary | ICD-10-CM

## 2019-02-12 MED ORDER — GADOTERIDOL 279.3 MG/ML IV SOLN
279.3 MG/ML | Freq: Once | INTRAVENOUS | Status: AC | PRN
Start: 2019-02-12 — End: 2019-02-12
  Administered 2019-02-12: 22:00:00 17 mL via INTRAVENOUS

## 2019-07-30 ENCOUNTER — Ambulatory Visit: Payer: PRIVATE HEALTH INSURANCE

## 2019-08-13 ENCOUNTER — Inpatient Hospital Stay: Admit: 2019-08-13 | Payer: PRIVATE HEALTH INSURANCE

## 2019-08-13 ENCOUNTER — Encounter

## 2019-08-13 DIAGNOSIS — Z1231 Encounter for screening mammogram for malignant neoplasm of breast: Secondary | ICD-10-CM

## 2020-08-28 ENCOUNTER — Inpatient Hospital Stay: Admit: 2020-08-28 | Payer: PRIVATE HEALTH INSURANCE

## 2020-08-28 ENCOUNTER — Encounter

## 2020-08-28 DIAGNOSIS — Z1231 Encounter for screening mammogram for malignant neoplasm of breast: Secondary | ICD-10-CM

## 2021-03-09 ENCOUNTER — Encounter

## 2021-03-16 ENCOUNTER — Inpatient Hospital Stay: Admit: 2021-03-16 | Payer: PRIVATE HEALTH INSURANCE

## 2021-03-16 DIAGNOSIS — N6082 Other benign mammary dysplasias of left breast: Secondary | ICD-10-CM

## 2021-03-16 DIAGNOSIS — Z1231 Encounter for screening mammogram for malignant neoplasm of breast: Secondary | ICD-10-CM

## 2021-03-16 MED ORDER — GADOTERIDOL 279.3 MG/ML IV SOLN
279.3 MG/ML | Freq: Once | INTRAVENOUS | Status: AC | PRN
Start: 2021-03-16 — End: 2021-03-16
  Administered 2021-03-16: 20:00:00 via INTRAVENOUS

## 2021-09-10 ENCOUNTER — Inpatient Hospital Stay: Admit: 2021-09-10 | Payer: PRIVATE HEALTH INSURANCE

## 2021-09-10 ENCOUNTER — Encounter

## 2021-09-10 DIAGNOSIS — Z1231 Encounter for screening mammogram for malignant neoplasm of breast: Secondary | ICD-10-CM

## 2021-12-05 LAB — FECAL DNA COLORECTAL CANCER SCREENING (COLOGUARD): FIT-DNA (Cologuard): NEGATIVE

## 2022-03-23 ENCOUNTER — Encounter

## 2022-03-31 ENCOUNTER — Ambulatory Visit: Payer: PRIVATE HEALTH INSURANCE

## 2022-04-07 ENCOUNTER — Inpatient Hospital Stay: Admit: 2022-04-07 | Payer: PRIVATE HEALTH INSURANCE

## 2022-04-07 DIAGNOSIS — Z1239 Encounter for other screening for malignant neoplasm of breast: Secondary | ICD-10-CM

## 2022-04-07 MED ORDER — GADOTERIDOL 279.3 MG/ML IV SOLN
279.3 MG/ML | Freq: Once | INTRAVENOUS | Status: AC | PRN
Start: 2022-04-07 — End: 2022-04-07
  Administered 2022-04-07: 20:00:00 17 mL via INTRAVENOUS

## 2022-04-07 MED ORDER — NORMAL SALINE FLUSH 0.9 % IV SOLN
0.9 % | Freq: Two times a day (BID) | INTRAVENOUS | Status: AC
Start: 2022-04-07 — End: 2022-04-08
  Administered 2022-04-07: 20:00:00 10 mL via INTRAVENOUS

## 2023-06-28 IMAGING — MR MRI BREAST BILATERAL W/WO CONTRAST
9 of 15 series · 27 of 48 positions shown · IV contrast (gadolinium)
Comparison: 10/14/2022 and MR exam of 04/07/2022. Exams dating back to 08/13/2019

________________________________________________________________________________________________ 
MRI BREAST BILATERAL W/WO CONTRAST, 06/28/2023 [DATE]: 
CLINICAL INDICATION: [Biopsy and precancerous cells removed by surgery. Family 
history, aunt.
TECHNIQUE: Multiple sequences were obtained in various planes with both before 
and after the intravenous administration of gadolinium. In addition to the 
routine images, three-dimensional renderings were performed on an independent 
workstation, time activity curves generated over areas of enhancement and 
computer-aided detection utilized. 8.0 mL of Gadavist were injected 
intravenously. 2 mL of Gadavist  discarded. Patient was scanned on a
magnet.

[Series 103: patient aligned mpr · axial · 12.0mm · 3.40mm/px · 1 of 4 slices shown]
[im 1/4]
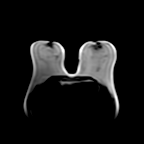

[Series 201: survey · axial · 10.0mm · 1.76mm/px · 1 of 35 slices shown]
[im 1/35]
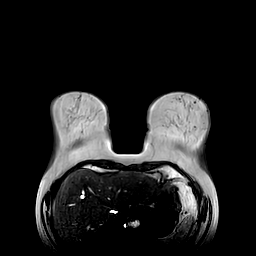

[Series 301: t1w_ffe_3d_cs · axial · 1.6mm · 0.72mm/px · z∈[-134,+95]mm · 4 of 263 slices shown]
[im 1/263]
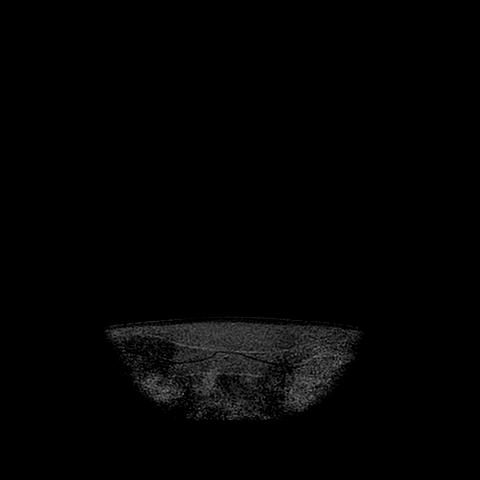
[im 88/263]
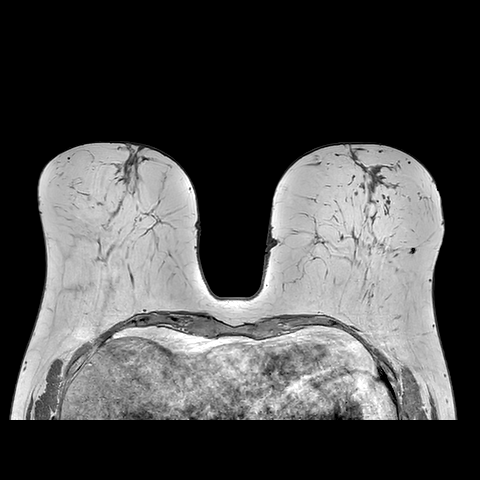
[im 175/263]
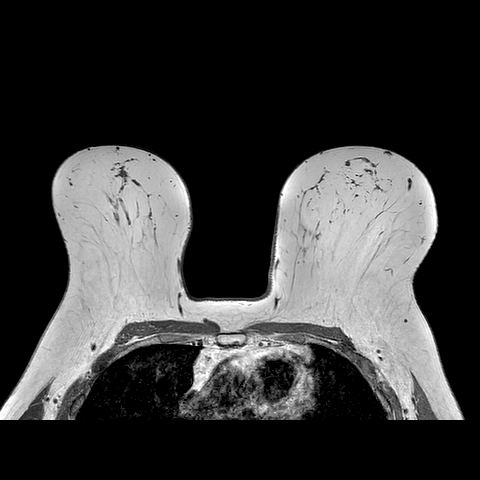
[im 263/263]
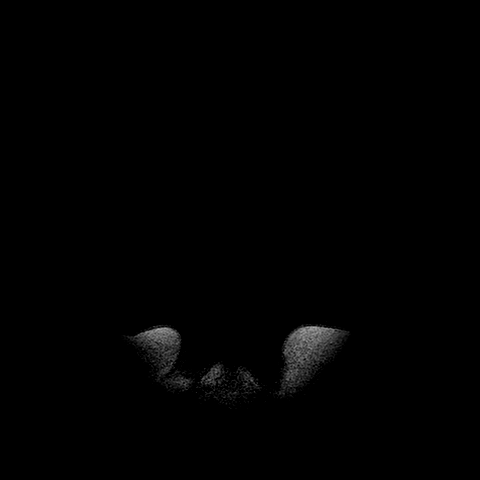

[Series 401: 3d_breastview_t2_spair_16ch · axial · 2.5mm · 0.77mm/px · z∈[-134,+95]mm · 2 of 178 slices shown (1 of 2)]
[im 1/178]
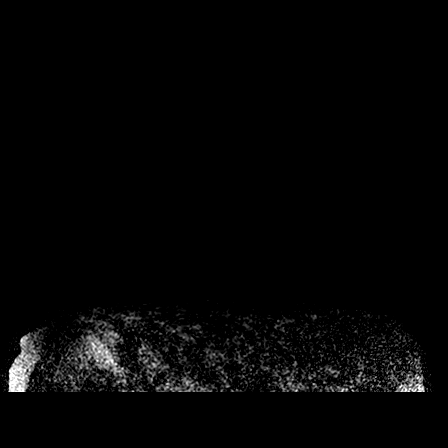
[im 178/178]
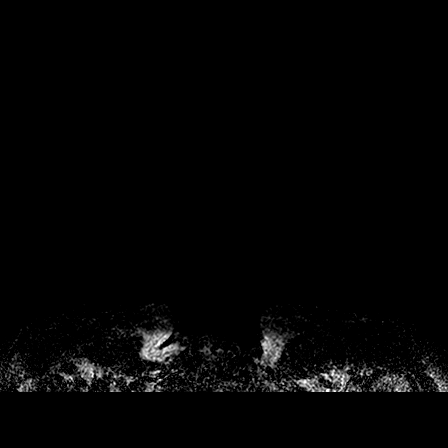

[Series 501: 3d_breastview_t2_spair_16ch · axial · 2.5mm · 0.77mm/px · z∈[-134,+95]mm · 2 of 178 slices shown (2 of 2)]
[im 1/178]
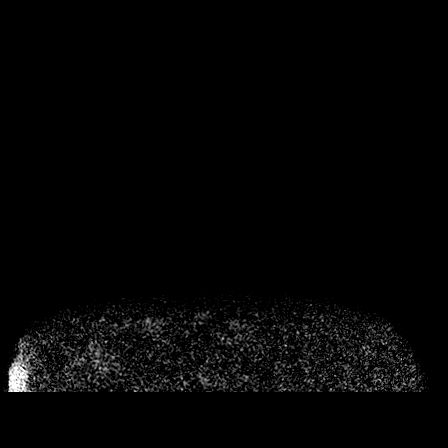
[im 178/178]
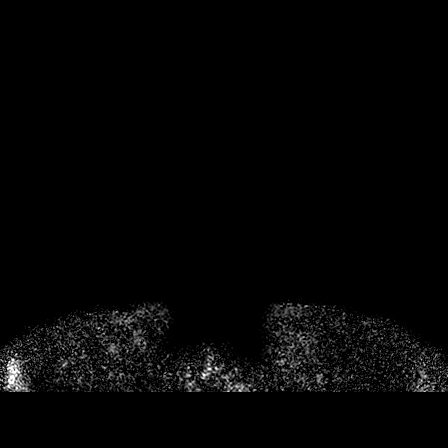

[Series 601: dyn_ethrive · axial · 2.0mm · 0.77mm/px · z∈[-134,+95]mm · 8 of 920 slices shown]
[im 1/920]
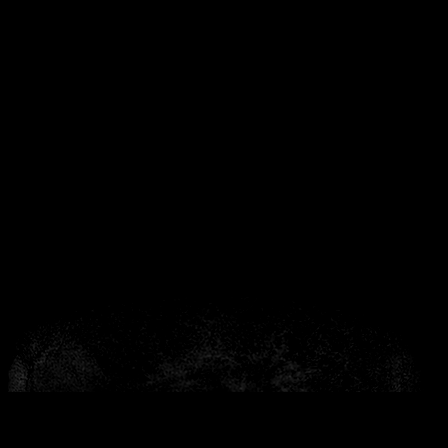
[im 154/920]
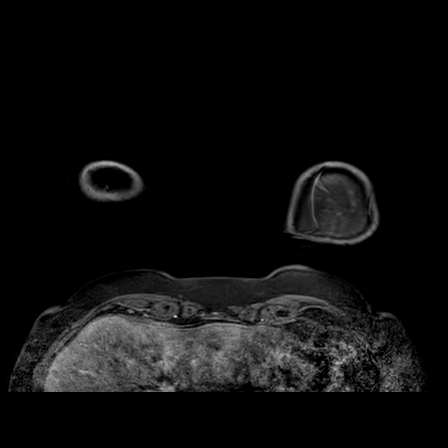
[im 307/920]
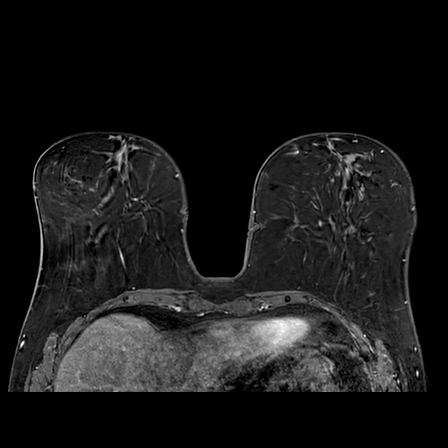
[im 383/920]
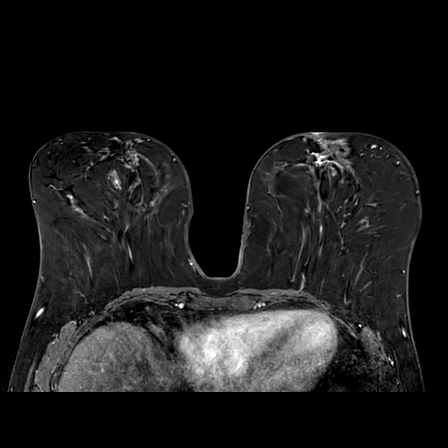
[im 537/920]
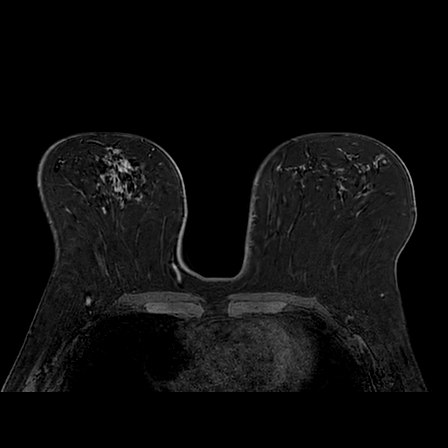
[im 613/920]
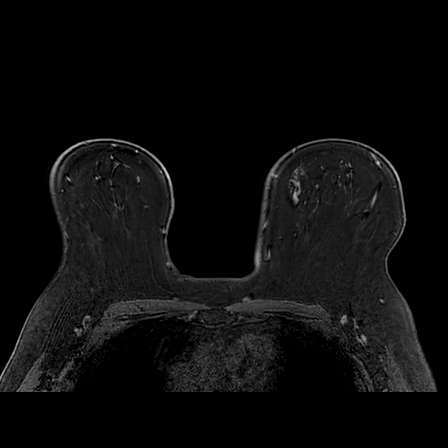
[im 766/920]
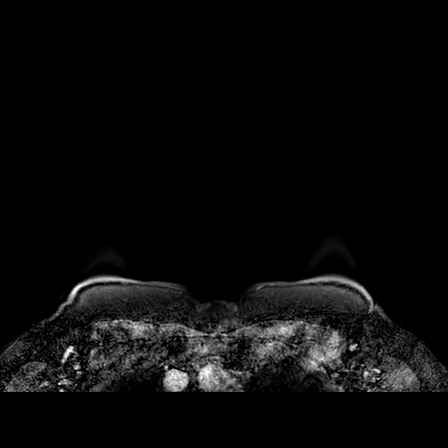
[im 920/920]
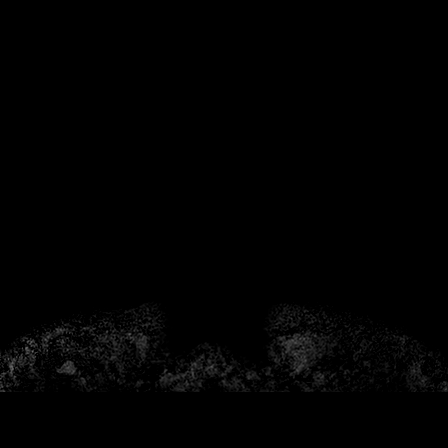

[Series 602: T1 dynamic fat-sat · axial · 2.0mm · 0.77mm/px · z∈[-134,+95]mm · 3 of 230 slices shown]
[im 1/230]
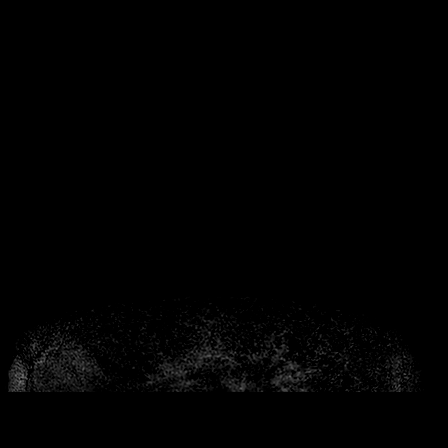
[im 115/230]
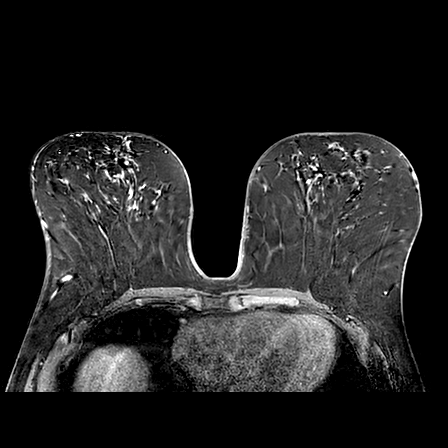
[im 230/230]
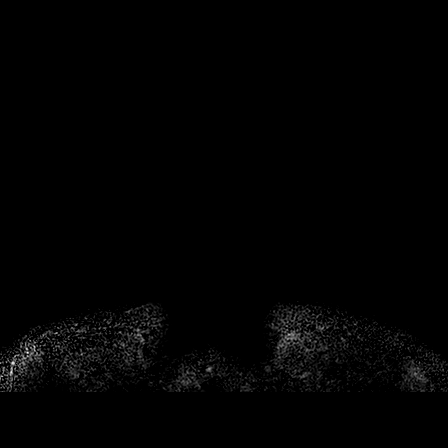

[Series 603: T1 dynamic fat-sat post-contrast · axial · 2.0mm · 0.77mm/px · z∈[-134,+95]mm · 3 of 230 slices shown (1 of 2)]
[im 1/230]
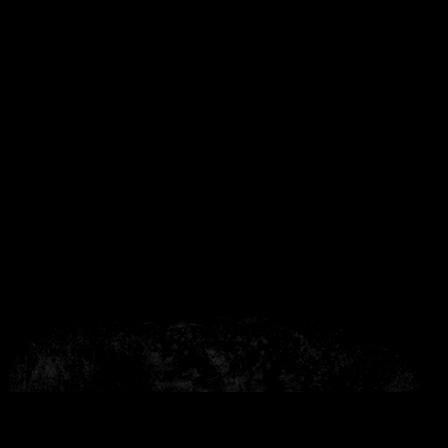
[im 115/230]
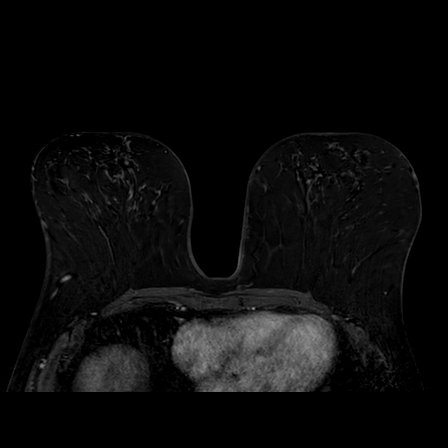
[im 230/230]
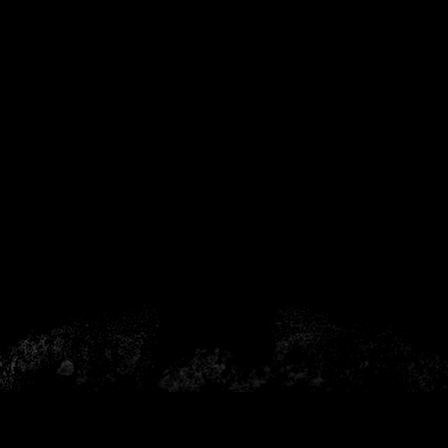

[Series 604: T1 dynamic fat-sat post-contrast · axial · 2.0mm · 0.77mm/px · z∈[-134,+95]mm · 3 of 230 slices shown (2 of 2)]
[im 1/230]
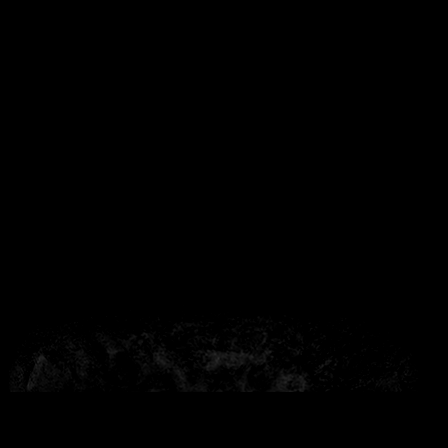
[im 115/230]
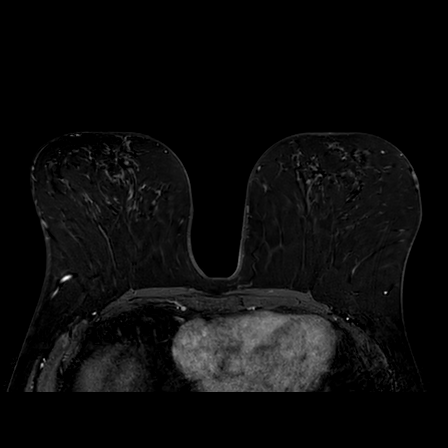
[im 230/230]
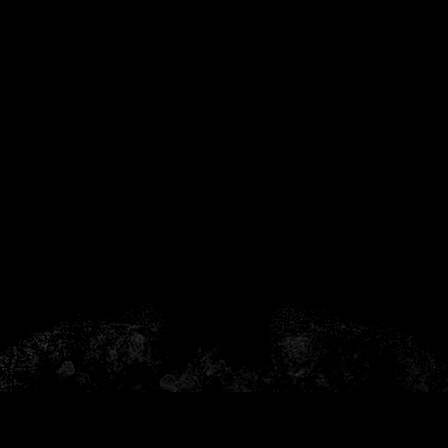

[27 of 48 positions shown; findings below may reference images not displayed]

FINDINGS: Minimal background parenchymal enhancement. Scattered fibroglandular 
breast parenchyma. 
Right breast: No abnormal areas of enhancement. No areas of enhancement meeting 
threshold criteria on CAD analysis. Small nonspecific right axillary lymph 
nodes.  No internal mammary nodes. 
Left breast: No abnormal areas of enhancement. No areas of enhancement meeting 
threshold criteria on CAD analysis. Small nonspecific left axillary lymph nodes. 
 No internal mammary nodes. 
Stable MR appearance.
IMPRESSION: No MR findings suggestive for malignancy. 
(BI-RADS 2) Benign findings. Routine mammographic follow-up is recommended.

## 2023-11-28 IMAGING — MG MAMMOGRAPHY SCREENING BILATERAL 3[PERSON_NAME]
8 series · 8 of 24 positions shown · non-contrast
Comparison: Comparison was made to prior examinations.

________________________________________________________________________________________________ 
MAMMOGRAPHY SCREENING BILATERAL 3SILVESTER MOSKOWITZ, 11/28/2023 [DATE]: 
CLINICAL INDICATION: Encounter for screening mammogram.
TECHNIQUE: Digital bilateral mammograms and 3-D Tomosynthesis were obtained. 
These were interpreted both primarily and with the aid of computer-aided 
detection system.  
BREAST DENSITY: (Level B) There are scattered areas of fibroglandular density.

[R MLO]
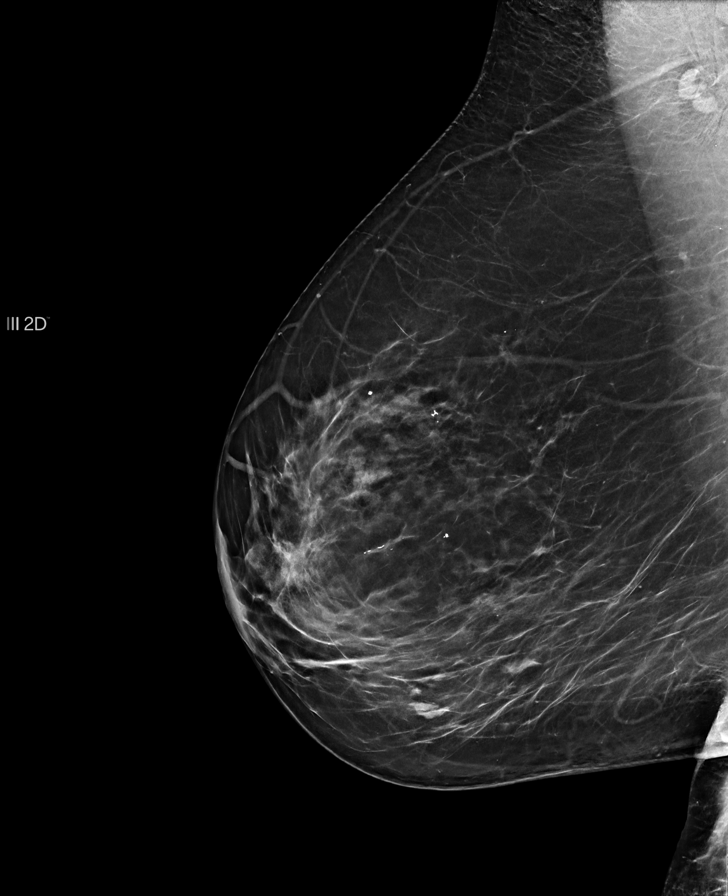

[R CC]
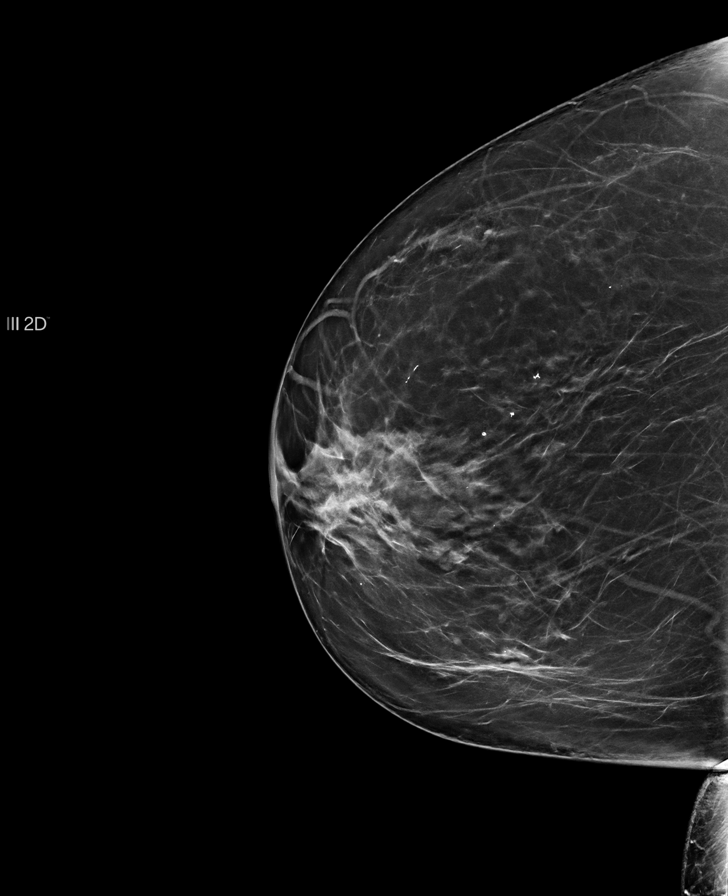

[L MLO]
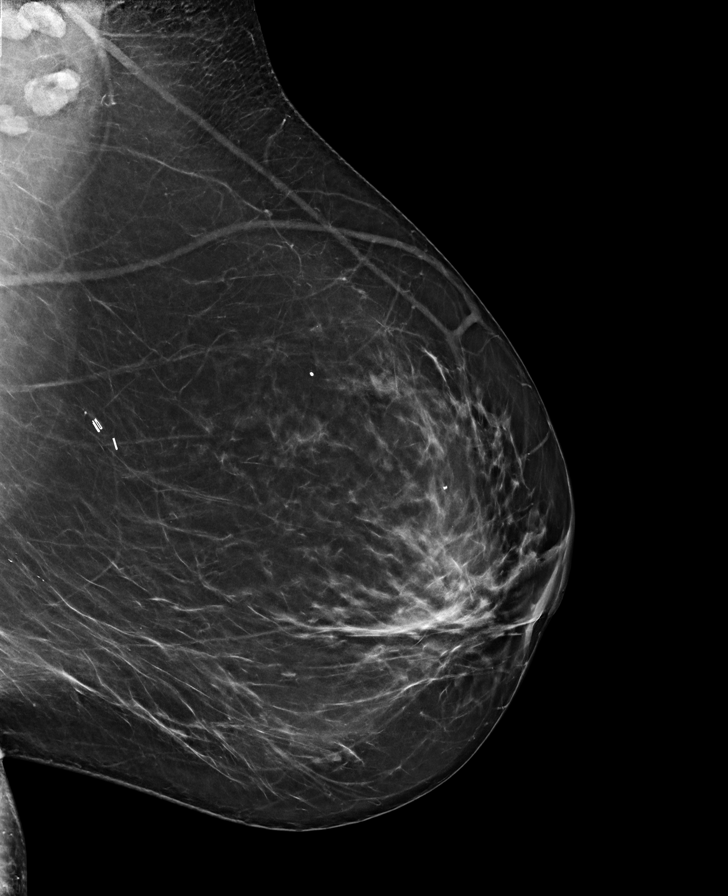

[L CC]
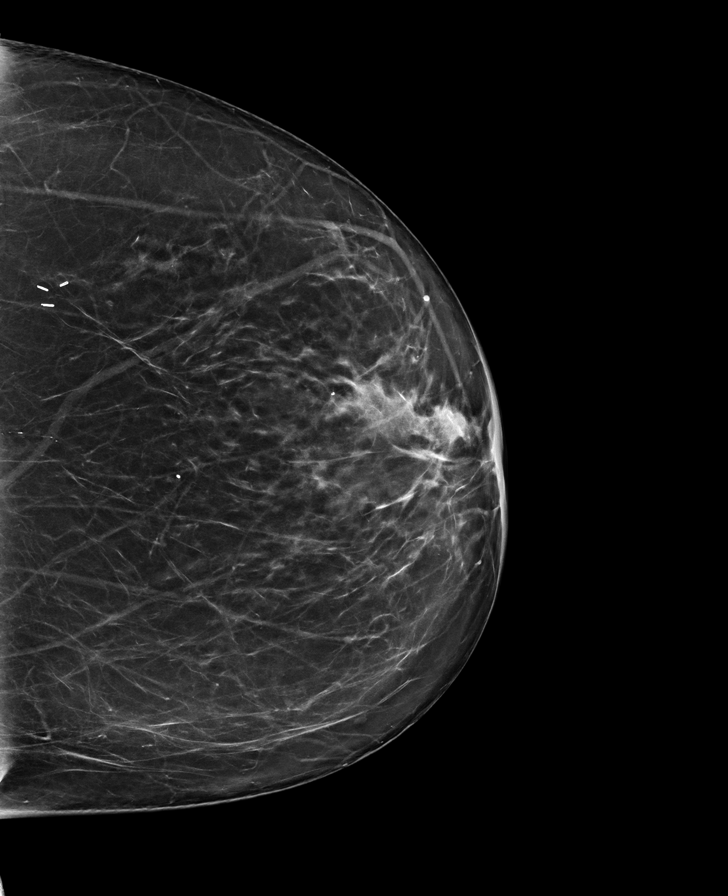

[L CC tomo · tomo slice 13/24.0]
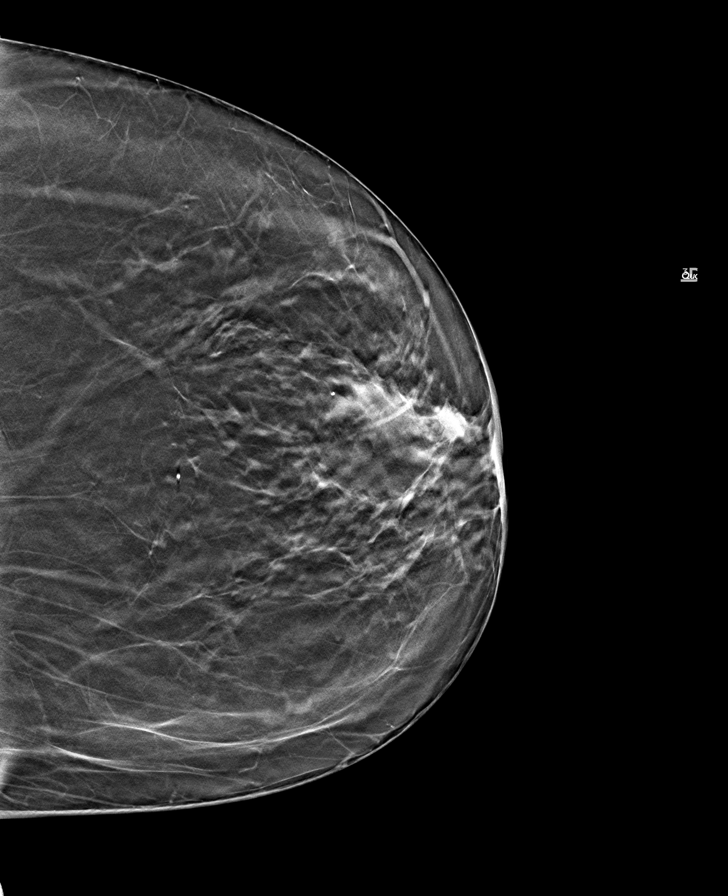

[R MLO tomo · tomo slice 13/26.0]
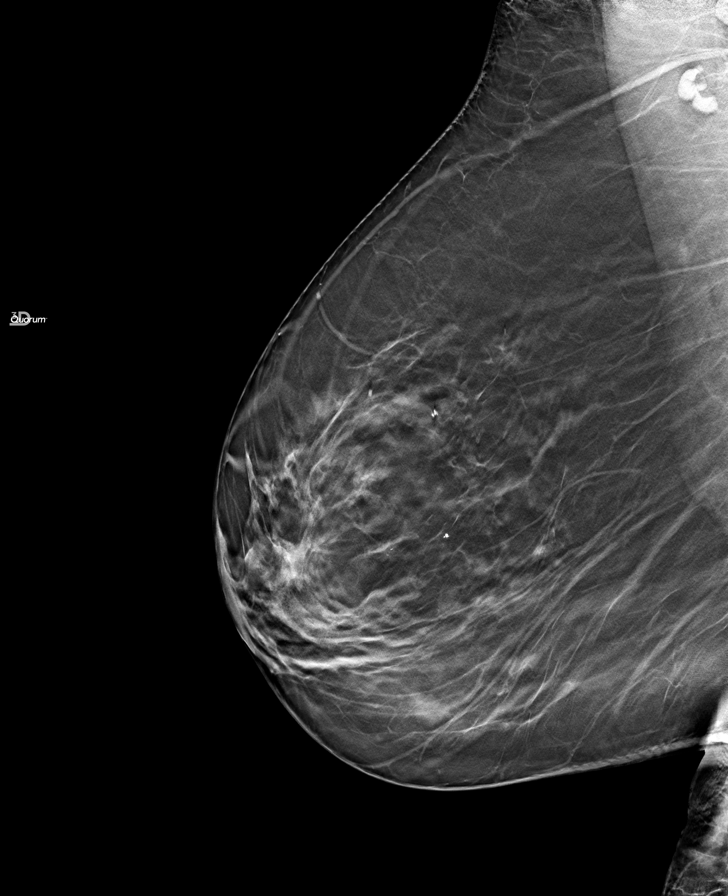

[R CC tomo · tomo slice 13/26.0]
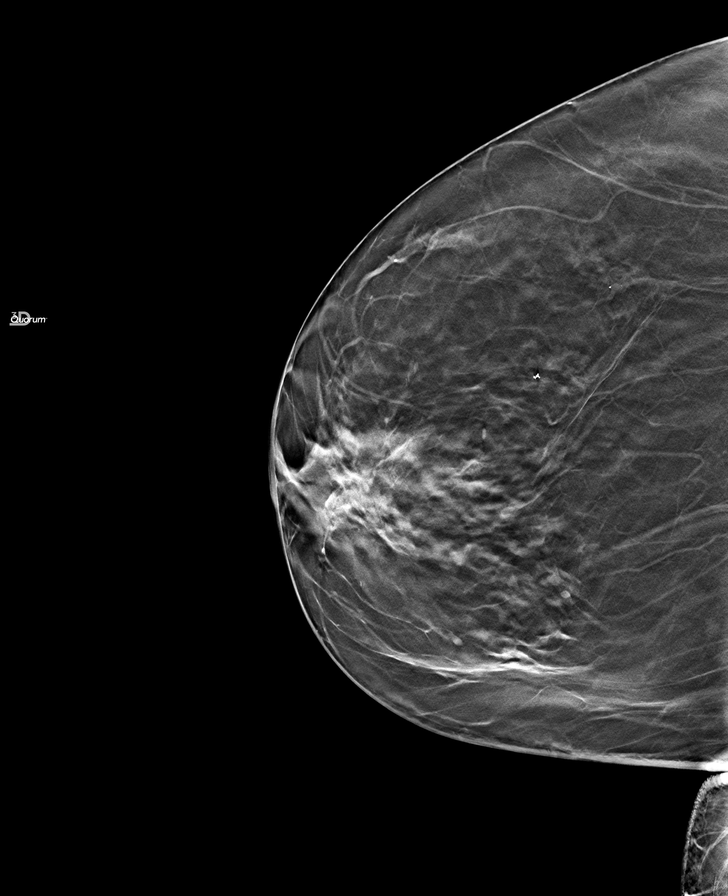

[L MLO tomo · tomo slice 14/27.0]
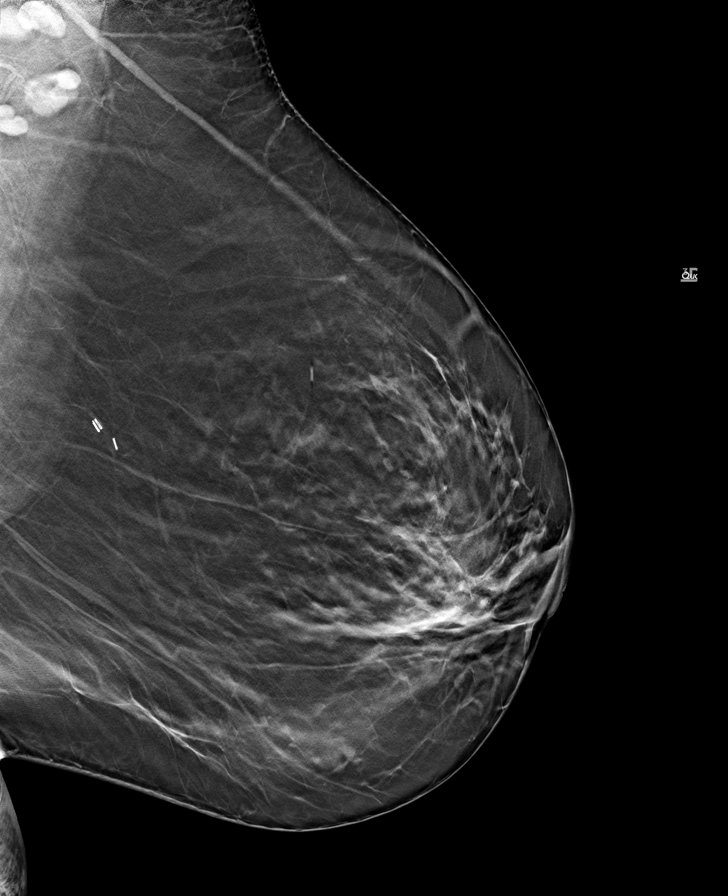

[8 of 24 positions shown; findings below may reference images not displayed]

FINDINGS: No suspicious mass, calcifications, or area of architectural 
distortion in either breast.
IMPRESSION: (BI-RADS 2) Benign findings. Routine mammographic follow-up is recommended.
